# Patient Record
Sex: Male | Born: 1965 | Hispanic: No | Marital: Married | Smoking: Former smoker
Health system: Southern US, Community
[De-identification: ages and names within clinical notes are randomized; demographics above are authoritative.]

## PROBLEM LIST (undated history)

## (undated) DIAGNOSIS — F32A Depression, unspecified: Secondary | ICD-10-CM

## (undated) DIAGNOSIS — E119 Type 2 diabetes mellitus without complications: Secondary | ICD-10-CM

## (undated) DIAGNOSIS — I10 Essential (primary) hypertension: Secondary | ICD-10-CM

## (undated) DIAGNOSIS — E7800 Pure hypercholesterolemia, unspecified: Secondary | ICD-10-CM

## (undated) DIAGNOSIS — M109 Gout, unspecified: Secondary | ICD-10-CM

## (undated) DIAGNOSIS — E781 Pure hyperglyceridemia: Secondary | ICD-10-CM

## (undated) DIAGNOSIS — F329 Major depressive disorder, single episode, unspecified: Secondary | ICD-10-CM

## (undated) HISTORY — PX: LAPAROSCOPIC GASTRIC BANDING: SHX1100

---
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
## 1996-09-17 DIAGNOSIS — M109 Gout, unspecified: Secondary | ICD-10-CM | POA: Insufficient documentation

## 1998-02-06 DIAGNOSIS — E669 Obesity, unspecified: Secondary | ICD-10-CM | POA: Insufficient documentation

## 1998-02-17 DIAGNOSIS — E7800 Pure hypercholesterolemia, unspecified: Secondary | ICD-10-CM | POA: Insufficient documentation

## 2013-02-12 DIAGNOSIS — I10 Essential (primary) hypertension: Secondary | ICD-10-CM | POA: Insufficient documentation

## 2015-08-05 DIAGNOSIS — F329 Major depressive disorder, single episode, unspecified: Secondary | ICD-10-CM | POA: Insufficient documentation

## 2016-06-14 DIAGNOSIS — G4733 Obstructive sleep apnea (adult) (pediatric): Secondary | ICD-10-CM | POA: Insufficient documentation

## 2016-07-18 DIAGNOSIS — K219 Gastro-esophageal reflux disease without esophagitis: Secondary | ICD-10-CM | POA: Insufficient documentation

## 2017-09-12 DIAGNOSIS — N400 Enlarged prostate without lower urinary tract symptoms: Secondary | ICD-10-CM | POA: Insufficient documentation

## 2017-09-12 DIAGNOSIS — M109 Gout, unspecified: Secondary | ICD-10-CM | POA: Insufficient documentation

## 2017-11-06 DIAGNOSIS — F988 Other specified behavioral and emotional disorders with onset usually occurring in childhood and adolescence: Secondary | ICD-10-CM | POA: Insufficient documentation

## 2018-12-23 ENCOUNTER — Emergency Department
Admission: EM | Admit: 2018-12-23 | Discharge: 2018-12-23 | Disposition: A | Discharge status: Home / Self Care | Payer: 59 | Attending: Emergency Medicine | Admitting: Emergency Medicine

## 2018-12-23 ENCOUNTER — Encounter: Payer: Self-pay | Admitting: Emergency Medicine

## 2018-12-23 ENCOUNTER — Emergency Department: Payer: 59

## 2018-12-23 ENCOUNTER — Other Ambulatory Visit: Payer: Self-pay

## 2018-12-23 DIAGNOSIS — I10 Essential (primary) hypertension: Secondary | ICD-10-CM | POA: Diagnosis not present

## 2018-12-23 DIAGNOSIS — R0789 Other chest pain: Secondary | ICD-10-CM

## 2018-12-23 DIAGNOSIS — Z79899 Other long term (current) drug therapy: Secondary | ICD-10-CM | POA: Diagnosis not present

## 2018-12-23 DIAGNOSIS — E119 Type 2 diabetes mellitus without complications: Secondary | ICD-10-CM | POA: Diagnosis not present

## 2018-12-23 DIAGNOSIS — Z87891 Personal history of nicotine dependence: Secondary | ICD-10-CM | POA: Insufficient documentation

## 2018-12-23 HISTORY — DX: Essential (primary) hypertension: I10

## 2018-12-23 HISTORY — DX: Depression, unspecified: F32.A

## 2018-12-23 HISTORY — DX: Pure hyperglyceridemia: E78.1

## 2018-12-23 HISTORY — DX: Gout, unspecified: M10.9

## 2018-12-23 HISTORY — DX: Pure hypercholesterolemia, unspecified: E78.00

## 2018-12-23 HISTORY — DX: Type 2 diabetes mellitus without complications: E11.9

## 2018-12-23 LAB — BASIC METABOLIC PANEL
Anion gap: 12 (ref 5–15)
BUN: 14 mg/dL (ref 6–20)
CO2: 28 mmol/L (ref 22–32)
Calcium: 9.8 mg/dL (ref 8.9–10.3)
Chloride: 100 mmol/L (ref 98–111)
Creatinine, Ser: 1.15 mg/dL (ref 0.61–1.24)
GFR calc Af Amer: >60 mL/min (ref >60)
GFR calc non Af Amer: >60 mL/min (ref >60)
Glucose, Bld: 113 mg/dL — ABNORMAL HIGH (ref 70–99)
Potassium: 3.8 mmol/L (ref 3.5–5.1)
Sodium: 140 mmol/L (ref 135–145)

## 2018-12-23 LAB — CBC
HCT: 42.3 % (ref 39.0–52.0)
Hemoglobin: 15 g/dL (ref 13.0–17.0)
MCH: 30.2 pg (ref 26.0–34.0)
MCHC: 35.5 g/dL (ref 30.0–36.0)
MCV: 85.1 fL (ref 80.0–100.0)
Platelets: 272 10*3/uL (ref 150–400)
RBC: 4.97 MIL/uL (ref 4.22–5.81)
RDW: 13 % (ref 11.5–15.5)
WBC: 8.3 10*3/uL (ref 4.0–10.5)
nRBC: 0 % (ref 0.0–0.2)

## 2018-12-23 LAB — TROPONIN I (HIGH SENSITIVITY)
Troponin I (High Sensitivity): 4 ng/L (ref <18)
Troponin I (High Sensitivity): 3 ng/L (ref <18)

## 2018-12-23 MED ORDER — SODIUM CHLORIDE 0.9% FLUSH: 3.0000 mL | Freq: Once | INTRAVENOUS | Status: DC

## 2018-12-23 NOTE — ED Notes (Signed)
Patient transported to Xray by ED tech.

## 2018-12-23 NOTE — ED Notes (Signed)
Pt reports experiencing intense pain in his left chest, shoulder, and arm that began tonight while drying. States that his arm began to tingle after. States that yesterday he experiencing sharp chest pain yesterday that lasted 5 minutes. Denies SOB, diaphoresis. States that he has experienced dizziness along with the chest pain. States that his wife believes that he may be having these problems as a result of his ADHD medication. Reports hx of tendinitis in left arm.

## 2018-12-23 NOTE — ED Provider Notes (Signed)
[facility] Emergency Department Provider Note  ____________________________________________   First MD Initiated Contact with Patient 12/23/18 1833     (approximate)  I have reviewed the triage vital signs and the nursing notes.   HISTORY  Chief Complaint Chest Pain    HPI Cataldo Cosgriff is a 53 y.o. male  Here with chest pain. Pt was driving earlier when he began to experience a tingling sensation in his left upper arm. It then spread across his upper chest to his right upper arm. This was associated with some general tingling, mild aching. He told his wife, who is also in health care, who then questioned whether he was having a cardiac event. Pt subsequently understandably became anxious and felt somewhat lightheaded and worse. He subsequently presents for evaluation. He has no h/o known cardiac disease but does have HTN. Of note, he recently increased his Concentra, which he has noted has made him more anxious and have elevated Bp. No pain currently.  HPI: A 53 year old patient with a history of hypertension presents for evaluation of chest pain. Initial onset of pain was approximately 1-3 hours ago. The patient's chest pain is described as heaviness/pressure/tightness, is sharp and is not worse with exertion. The patient's chest pain is middle- or left-sided, is not well-localized and does not radiate to the arms/jaw/neck. The patient does not complain of nausea and denies diaphoresis. The patient has no history of stroke, has no history of peripheral artery disease, has not smoked in the past 90 days, denies any history of treated diabetes, has no relevant family history of coronary artery disease (first degree relative at less than age 25), has no history of hypercholesterolemia and does not have an elevated BMI (>=30).      Past Medical History:  Diagnosis Date  . Depression   . Diabetes mellitus without complication (HCC)    pre  . Gout   . High  cholesterol   . Hypertension   . Hypertriglyceridemia     There are no active problems to display for this patient.   Past Surgical History:  Procedure Laterality Date  . LAPAROSCOPIC GASTRIC BANDING      Prior to Admission medications   Not on File    Allergies Statins  No family history on file.  Social History Social History   Tobacco Use  . Smoking status: Former Games developer  . Smokeless tobacco: Never Used  Substance Use Topics  . Alcohol use: Not on file  . Drug use: Not on file    Review of Systems  Review of Systems  Constitutional: Negative for chills, fatigue and fever.  HENT: Negative for sore throat.   Respiratory: Positive for chest tightness. Negative for shortness of breath.   Cardiovascular: Negative for chest pain.  Gastrointestinal: Negative for abdominal pain.  Genitourinary: Negative for flank pain.  Musculoskeletal: Positive for arthralgias. Negative for neck pain.  Skin: Negative for rash and wound.  Allergic/Immunologic: Negative for immunocompromised state.  Neurological: Positive for light-headedness. Negative for weakness and numbness.  Hematological: Does not bruise/bleed easily.  All other systems reviewed and are negative.    ____________________________________________  PHYSICAL EXAM:      VITAL SIGNS: ED Triage Vitals  Enc Vitals Group     BP 12/23/18 1804 (!) 144/91     Pulse Rate 12/23/18 1804 87     Resp 12/23/18 1804 16     Temp 12/23/18 1804 98.5 F (36.9 C)     Temp Source 12/23/18 1804 Oral  SpO2 12/23/18 1804 100 %     Weight 12/23/18 1802 220 lb (99.8 kg)     Height 12/23/18 1802 5\' 8"  (1.727 m)     Head Circumference --      Peak Flow --      Pain Score 12/23/18 1802 7     Pain Loc --      Pain Edu? --      Excl. in [place]? --      Physical Exam Vitals signs and nursing note reviewed.  Constitutional:      General: He is not in acute distress.    Appearance: He is well-developed.  HENT:     Head:  Normocephalic and atraumatic.  Eyes:     Conjunctiva/sclera: Conjunctivae normal.  Neck:     Musculoskeletal: Neck supple.  Cardiovascular:     Rate and Rhythm: Normal rate and regular rhythm.     Heart sounds: Normal heart sounds. No murmur. No friction rub.  Pulmonary:     Effort: Pulmonary effort is normal. No respiratory distress.     Breath sounds: Normal breath sounds. No wheezing or rales.  Abdominal:     General: There is no distension.     Palpations: Abdomen is soft.     Tenderness: There is no abdominal tenderness.  Skin:    General: Skin is warm.     Capillary Refill: Capillary refill takes less than 2 seconds.  Neurological:     Mental Status: He is alert and oriented to person, place, and time.     Motor: No abnormal muscle tone.       ____________________________________________   LABS (all labs ordered are listed, but only abnormal results are displayed)  Labs Reviewed  BASIC METABOLIC PANEL - Abnormal; Notable for the following components:      Result Value   Glucose, Bld 113 (*)    All other components within normal limits  CBC  TROPONIN I (HIGH SENSITIVITY)  TROPONIN I (HIGH SENSITIVITY)    ____________________________________________  EKG: Normal sinus rhythm, VR 92. PR 148, QRS 102, QTc 460. No acute ST elevations or depressions. No ischemia or infarct. ________________________________________  RADIOLOGY All imaging, including plain films, CT scans, and ultrasounds, independently reviewed by me, and interpretations confirmed via formal radiology reads.  ED MD interpretation:   CXR: Clear  Official radiology report(s): Dg Chest 2 View  Result Date: 12/23/2018 CLINICAL DATA:  Left arm pain and numbness. EXAM: CHEST - 2 VIEW COMPARISON:  None. FINDINGS: The cardiomediastinal contours are normal. The lungs are clear. Pulmonary vasculature is normal. No consolidation, pleural effusion, or pneumothorax. No acute osseous abnormalities are seen.  IMPRESSION: No acute pulmonary process. Electronically Signed   By: Keith Rake M.D.   On: 12/23/2018 19:17    ____________________________________________  PROCEDURES   Procedure(s) performed (including Critical Care):  Procedures  ____________________________________________  INITIAL IMPRESSION / MDM / [place] / ED COURSE  As part of my medical decision making, I reviewed the following data within the [place] notes reviewed and incorporated, Old chart reviewed, Notes from prior ED visits, and [place] Controlled Substance Database  HEAR Score: 2    *Keldrick Pomplun was evaluated in Emergency Department on 12/23/2018 for the symptoms described in the history of present illness. He was evaluated in the context of the global COVID-19 pandemic, which necessitated consideration that the patient might be at risk for infection with the SARS-CoV-2 virus that causes COVID-19. Institutional protocols and algorithms that  pertain to the evaluation of patients at risk for COVID-19 are in a state of rapid change based on information released by regulatory bodies including the CDC and federal and state organizations. These policies and algorithms were followed during the patient's care in the ED.  Some ED evaluations and interventions may be delayed as a result of limited staffing during the pandemic.*     Medical Decision Making:  53 yo M here with atypical chest pain. Suspect possible MSK pain, less likely anxiety, less likely referred GI pain. May also consider referred pain from cervical etiology. His EKG is nonischemic and troponin neg x2 with HEART score <3 - do not suspect ACS at that time. Pain is not c/w PE. Labs otherwise reassuring. Of note, this did begin with increasing his prescribed Concentra, so symptomatic HTN or transient tachyarrhythmia could be a consideration. Will have him d/w his Psychiatrist and refer for outpt stress or CCTA.    ____________________________________________  FINAL CLINICAL IMPRESSION(S) / ED DIAGNOSES  Final diagnoses:  Atypical chest pain  Essential hypertension     MEDICATIONS GIVEN DURING THIS VISIT:  Medications - No data to display   ED Discharge Orders    None       Note:  This document was prepared using Dragon voice recognition software and may include unintentional dictation errors.   Shaune PollackIsaacs, Jahmier Willadsen, MD 12/23/18 [phone]

## 2018-12-23 NOTE — ED Notes (Signed)
ED Provider at bedside. 

## 2018-12-23 NOTE — Discharge Instructions (Addendum)
It was good to meet you today.  As we discussed, your HEART score was low (3) and with two negative high sensitivity Troponin I's, the likelihood of ischemia is low.  That being said, I'd recommend seeing Harrel Lemon soon to discuss possible repeat stress testing, or possible cardiac CT if you fit the screening criteria.  I'd suggest decreasing your Concerta and monitoring blood pressure. This could certainly be contributing to your high blood pressure and symptoms.  Have a good Thanskgiving!

## 2018-12-23 NOTE — ED Triage Notes (Signed)
C/O left arm pain / numbness while driving in the car this evening.  Onset of symptoms about 1 hour ago.  STates had an episode of chest discomfort yesterday.  AAOx3.  Skin warm and dry.  No SOB/ DOE.  NAD

## 2019-08-20 ENCOUNTER — Ambulatory Visit: Payer: No Typology Code available for payment source

## 2019-08-20 ENCOUNTER — Other Ambulatory Visit: Payer: Self-pay

## 2019-08-20 ENCOUNTER — Other Ambulatory Visit: Payer: Self-pay | Admitting: Neurology

## 2019-08-20 DIAGNOSIS — R200 Anesthesia of skin: Secondary | ICD-10-CM

## 2019-08-24 ENCOUNTER — Ambulatory Visit: Payer: No Typology Code available for payment source

## 2019-08-24 ENCOUNTER — Other Ambulatory Visit: Payer: Self-pay | Admitting: Neurology

## 2019-08-24 DIAGNOSIS — R200 Anesthesia of skin: Secondary | ICD-10-CM

## 2019-08-28 ENCOUNTER — Other Ambulatory Visit: Payer: Self-pay

## 2019-08-28 ENCOUNTER — Ambulatory Visit
Admission: RE | Admit: 2019-08-28 | Discharge: 2019-08-28 | Disposition: A | Discharge status: Home / Self Care | Payer: No Typology Code available for payment source | Source: Ambulatory Visit | Attending: Neurology | Admitting: Neurology

## 2019-08-28 DIAGNOSIS — R200 Anesthesia of skin: Secondary | ICD-10-CM

## 2019-08-28 MED ORDER — GADOBENATE DIMEGLUMINE 529 MG/ML IV SOLN
20.0000 mL | Freq: Once | INTRAVENOUS | Status: AC | PRN
  Administered 2019-08-28: 20 mL via INTRAVENOUS

## 2019-12-13 DIAGNOSIS — K9509 Other complications of gastric band procedure: Secondary | ICD-10-CM | POA: Insufficient documentation

## 2020-03-20 ENCOUNTER — Other Ambulatory Visit: Payer: Self-pay | Admitting: Surgical Oncology

## 2020-03-20 DIAGNOSIS — K9509 Other complications of gastric band procedure: Secondary | ICD-10-CM

## 2020-04-06 ENCOUNTER — Ambulatory Visit
Admission: RE | Admit: 2020-04-06 | Discharge: 2020-04-06 | Disposition: A | Discharge status: Home / Self Care | Payer: No Typology Code available for payment source | Source: Ambulatory Visit | Attending: Surgical Oncology | Admitting: Surgical Oncology

## 2020-04-06 ENCOUNTER — Other Ambulatory Visit: Payer: Self-pay | Admitting: Surgical Oncology

## 2020-04-06 DIAGNOSIS — K9509 Other complications of gastric band procedure: Secondary | ICD-10-CM

## 2020-07-11 DIAGNOSIS — E119 Type 2 diabetes mellitus without complications: Secondary | ICD-10-CM | POA: Insufficient documentation

## 2021-08-29 IMAGING — RF DG UGI W SINGLE CM
4 series · 14 of 24 positions shown · non-contrast
Comparison: None.

CLINICAL DATA: Dysphagia and epigastric discomfort. Gastric lap
band placement approximately 10 years ago.

EXAM:
UPPER GI SERIES WITH KUB
TECHNIQUE: After obtaining a scout radiograph a routine upper GI series was
performed using thin barium.
FLUOROSCOPY TIME:  Fluoroscopy Time:  3 minutes 36 seconds
Radiation Exposure Index (if provided by the fluoroscopic device):
791.8 mGy
Number of Acquired Spot Images: 0

[Series 1: one shot · 0.14mm/px · 3 of 6 slices shown (1 of 2)]
[im 1/6]
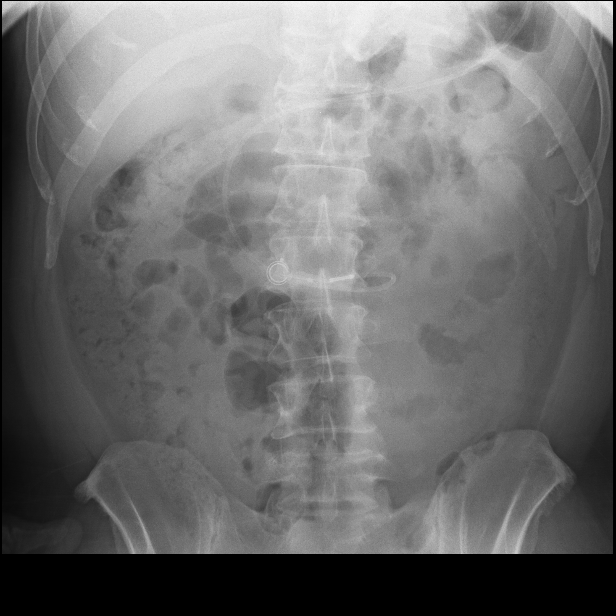
[im 4/6]
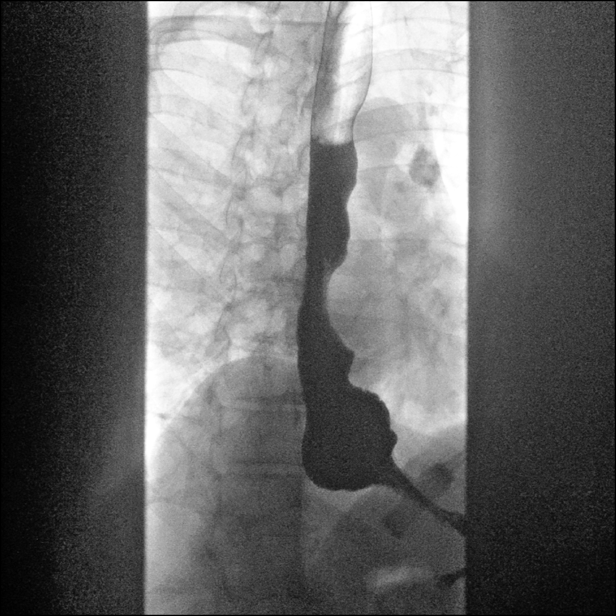
[im 6/6]
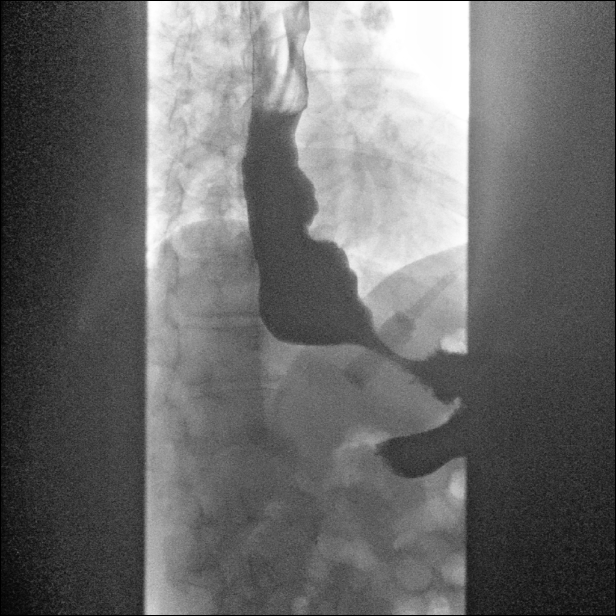

[Series 2: sequence · 2 of 49 frames shown (1 of 2)]
[frame 28/49]
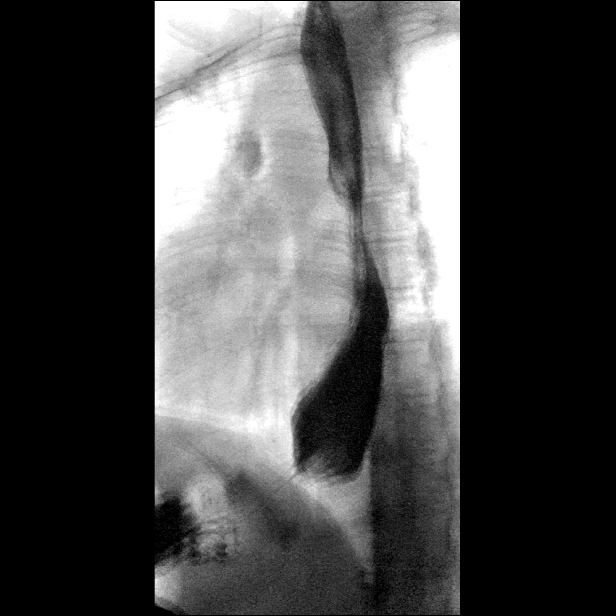
[frame 42/49]
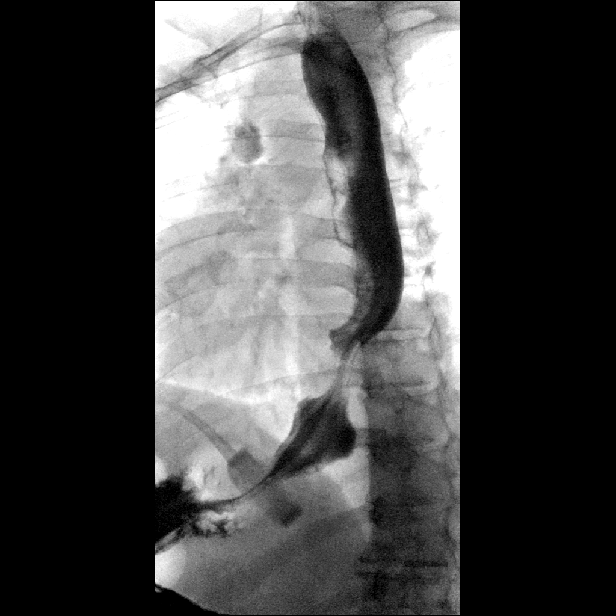

[Series 3: sequence · 1 of 74 frames shown (2 of 2)]
[frame 38/74]
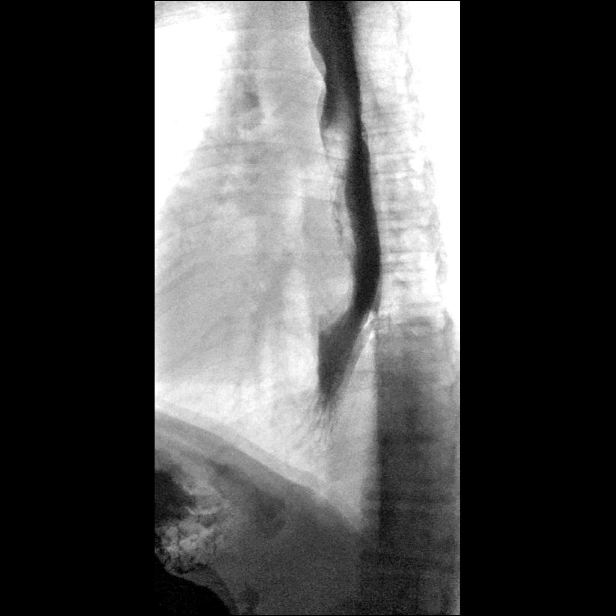

[Series 4: one shot · 8 of 17 slices shown (2 of 2)]
[im 1/17]
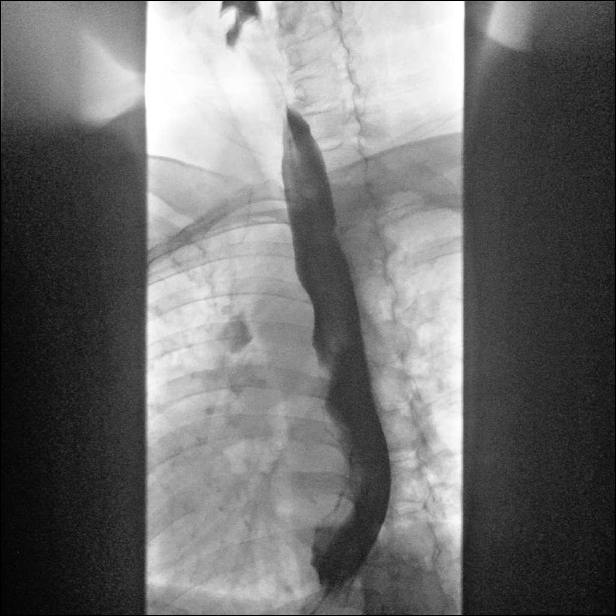
[im 2/17]
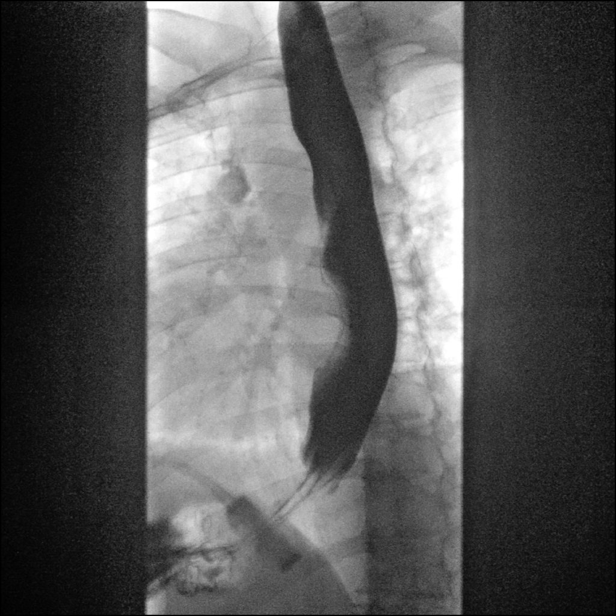
[im 5/17]
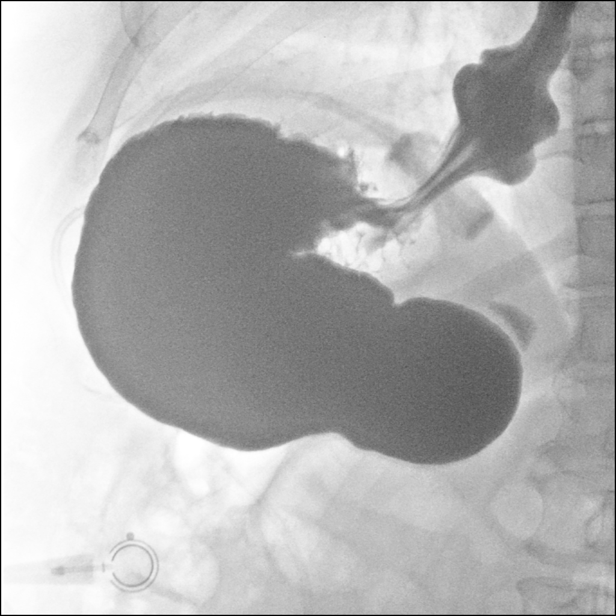
[im 7/17]
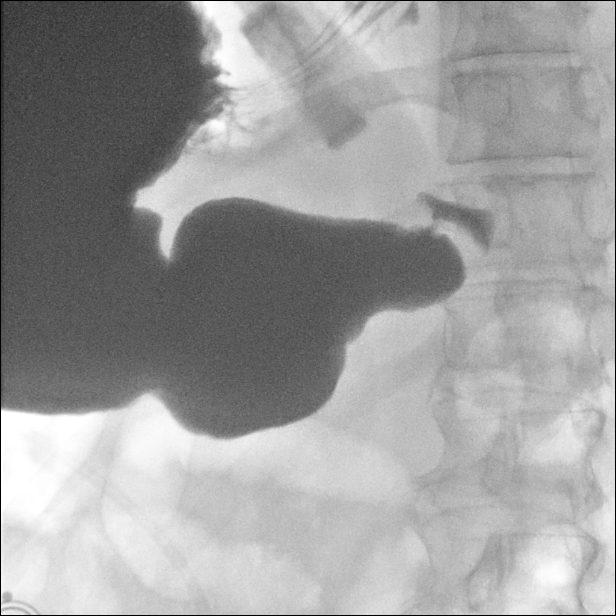
[im 10/17]
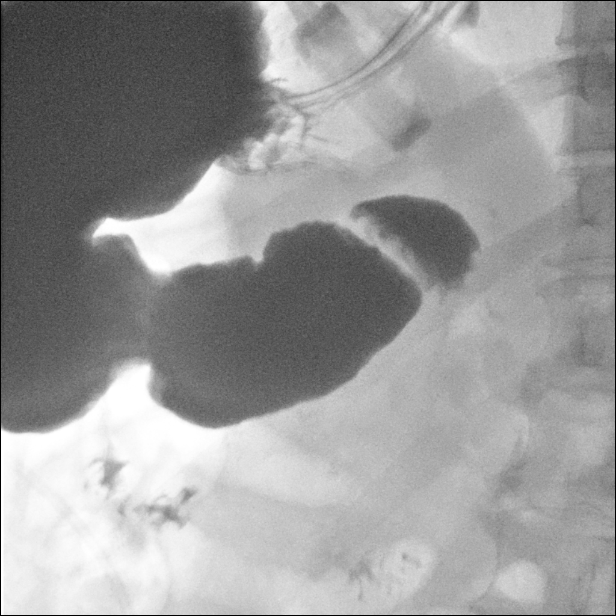
[im 11/17]
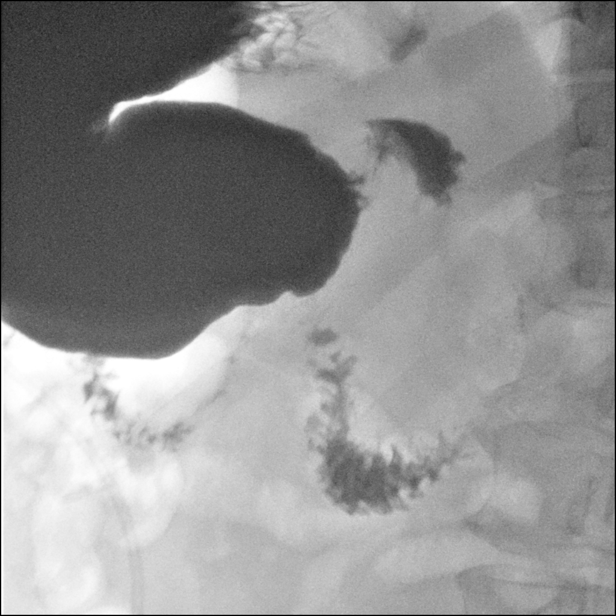
[im 13/17]
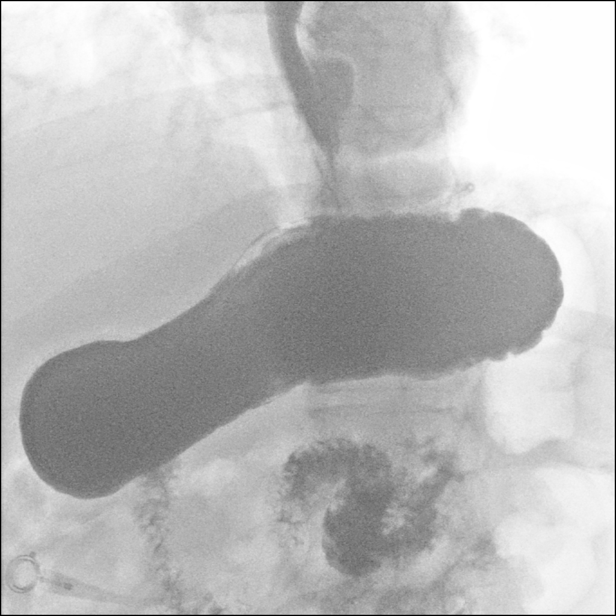
[im 17/17]
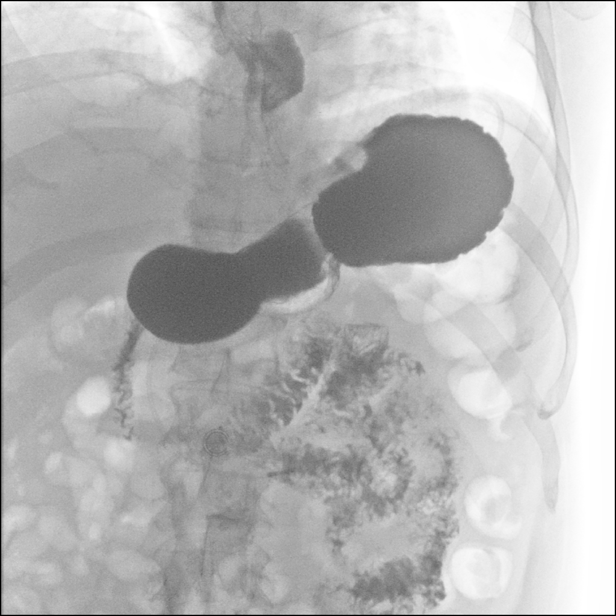

[14 of 24 positions shown; findings below may reference images not displayed]

FINDINGS: Scout radiograph: Unremarkable bowel gas pattern. Gastric lap band
is seen in appropriate position.

Esophagus: No evidence of esophageal mass or stricture. Gastric lap
band is seen in appropriate position. Mild dilatation of the distal
thoracic esophagus and ampulla is seen above the level of the lap
band. Mild smooth narrowing of the GE junction is seen at the site
of the lap band, however this does not prevent passage of barium
into the stomach.

Mild esophageal dysmotility is seen, with break up of primary
peristalsis in the midthoracic esophagus. Secondary peristaltic
waves are observed, however there is some retention of contrast in
the upper thoracic esophagus. No tertiary contractions seen. No
gastroesophageal reflux observed.

Stomach: Gastric lap band is seen in appropriate position. No hiatal
hernia visualized. Normal size and appearance. No evidence of
gastric mass or ulcer.

Duodenum: Normal appearance of duodenal bulb and sweep. Normal
position of ligament of Treitz. No ulcer, stricture, or other
significant abnormality seen.

Other:  None.
IMPRESSION: Gastric lap band in appropriate position. Mild smooth narrowing is
seen at the site of the lap band, however this does not prevent
passage of barium.

Mild dilatation of distal thoracic esophagus. Mild esophageal
dysmotility.

No evidence of esophageal stricture or hiatal hernia.

Normal appearance of stomach and duodenum.

## 2022-02-07 ENCOUNTER — Ambulatory Visit: Payer: BC Managed Care – PPO | Admitting: Urology

## 2022-02-07 ENCOUNTER — Encounter: Payer: Self-pay | Admitting: Urology

## 2022-02-07 VITALS — BP 130/74 | HR 80 | Ht 68.0 in | Wt 220.0 lb

## 2022-02-07 DIAGNOSIS — N401 Enlarged prostate with lower urinary tract symptoms: Secondary | ICD-10-CM | POA: Diagnosis not present

## 2022-02-07 DIAGNOSIS — Z125 Encounter for screening for malignant neoplasm of prostate: Secondary | ICD-10-CM

## 2022-02-07 DIAGNOSIS — R399 Unspecified symptoms and signs involving the genitourinary system: Secondary | ICD-10-CM | POA: Diagnosis not present

## 2022-02-07 DIAGNOSIS — N529 Male erectile dysfunction, unspecified: Secondary | ICD-10-CM

## 2022-02-07 LAB — URINALYSIS, COMPLETE
Bilirubin, UA: NEGATIVE
Glucose, UA: NEGATIVE
Ketones, UA: NEGATIVE
Leukocytes,UA: NEGATIVE
Nitrite, UA: NEGATIVE
Protein,UA: NEGATIVE
RBC, UA: NEGATIVE
Specific Gravity, UA: 1.025 (ref 1.005–1.030)
Urobilinogen, Ur: 0.2 mg/dL (ref 0.2–1.0)
pH, UA: 6 (ref 5.0–7.5)

## 2022-02-07 LAB — MICROSCOPIC EXAMINATION

## 2022-02-07 LAB — BLADDER SCAN AMB NON-IMAGING: Scan Result: 0

## 2022-02-07 MED ORDER — TADALAFIL 20 MG PO TABS: ORAL_TABLET | ORAL | 3 refills | Status: AC

## 2022-02-07 NOTE — Progress Notes (Signed)
02/07/2022 4:08 PM   [place] 1965-06-16 [id number]  Referring provider: Baxter Hire, MD [place],  [place] [address]  Chief Complaint  Patient presents with   Benign Prostatic Hypertrophy    HPI: Daniel Jarvis is a 57 y.o. male referred for evaluation of BPH  Has been taking dutasteride for ~ 10 years primarily for hair loss Notes occasional urgency and postvoid dribbling No dysuria or gross hematuria PSA 01/02/2022 was 0.12 (uncorrected) Also with difficulty maintaining an erection.  No previous treatments.  Organic ED risk factors include hypercholesterolemia, hypertension  PMH: Past Medical History:  Diagnosis Date   Depression    Diabetes mellitus without complication (HCC)    pre   Gout    High cholesterol    Hypertension    Hypertriglyceridemia     Surgical History: Past Surgical History:  Procedure Laterality Date   LAPAROSCOPIC GASTRIC BANDING      Home Medications:  Allergies as of 02/07/2022       Reactions   Statins         Medication List        Accurate as of February 07, 2022  4:08 PM. If you have any questions, ask your nurse or doctor.          allopurinol 300 MG tablet Commonly known as: ZYLOPRIM Take 1 tablet by mouth daily.   aspirin EC 81 MG tablet Take by mouth.   citalopram 40 MG tablet Commonly known as: CELEXA Take 40 mg by mouth daily.   dutasteride 0.5 MG capsule Commonly known as: AVODART Take 1 capsule by mouth daily.   hydrochlorothiazide 12.5 MG tablet Commonly known as: HYDRODIURIL Take by mouth.   Ozempic (0.25 or 0.5 MG/DOSE) 2 MG/3ML Sopn Generic drug: Semaglutide(0.25 or 0.5MG /DOS) Inject into the skin.        Allergies:  Allergies  Allergen Reactions   Statins     Family History: No family history on file.  Social History:  reports that he has quit smoking. He has never used smokeless tobacco. No history on file for alcohol use and drug use.   Physical  Exam: BP 130/74   Pulse 80   Ht 5\' 8"  (1.727 m)   Wt 220 lb (99.8 kg)   BMI 33.45 kg/m   Constitutional:  Alert, No acute distress. HEENT: [place] AT Respiratory: Normal respiratory effort, no increased work of breathing. GU: Prostate 25 g, smooth without nodules Psychiatric: Normal mood and affect.  Laboratory Data:  Urinalysis Dipstick/microscopy negative   Assessment & Plan:    1.  Lower urinary tract symptoms Mild LUTS which are not bothersome at present On dutasteride ~ 10 years for hair loss.  He inquired about any potential long-term side effects.  We discussed the 5-ARI medications do not increase the risk of high-grade prostate cancer-the increased diagnosis of high-grade cancer in the prostate cancer prevention trial was due to increased detection due to size reduction and he is okay to continue PVR today 0 mL If he has worsening symptoms in the future would add an alpha-blocker  2.  Erectile dysfunction Primarily difficulty maintaining an erection He was interested in a PDE 5 inhibitor trial and Rx tadalafil was sent to pharmacy If he has persistent difficulty maintaining an erection on these medications we discussed the possibility of venoocclusive disease and discussed use of a venous compression band  3.  Prostate cancer screening Discussed current prostate cancer screening recommendations by the AUA and NCCN of  a PSA and DRE annually between the ages of 56-69 He desires to continue annual urology follow-up   Abbie Sons, [place] [address] [place], [place] [address] [phone]

## 2022-02-10 ENCOUNTER — Encounter: Payer: Self-pay | Admitting: Urology

## 2022-05-19 DIAGNOSIS — R7989 Other specified abnormal findings of blood chemistry: Secondary | ICD-10-CM | POA: Insufficient documentation

## 2022-09-19 ENCOUNTER — Ambulatory Visit: Payer: BC Managed Care – PPO

## 2022-09-19 DIAGNOSIS — K602 Anal fissure, unspecified: Secondary | ICD-10-CM | POA: Diagnosis not present

## 2022-09-19 DIAGNOSIS — Z86010 Personal history of colonic polyps: Secondary | ICD-10-CM | POA: Diagnosis not present

## 2022-09-19 DIAGNOSIS — Z09 Encounter for follow-up examination after completed treatment for conditions other than malignant neoplasm: Secondary | ICD-10-CM | POA: Diagnosis present

## 2023-02-10 ENCOUNTER — Ambulatory Visit: Payer: BC Managed Care – PPO | Admitting: Urology

## 2023-02-17 ENCOUNTER — Ambulatory Visit: Payer: BC Managed Care – PPO | Admitting: Urology

## 2023-03-13 ENCOUNTER — Ambulatory Visit: Payer: Self-pay | Admitting: Urology

## 2023-03-13 VITALS — BP 133/78 | HR 74 | Ht 68.0 in | Wt 234.0 lb

## 2023-03-13 DIAGNOSIS — E291 Testicular hypofunction: Secondary | ICD-10-CM

## 2023-03-13 DIAGNOSIS — N529 Male erectile dysfunction, unspecified: Secondary | ICD-10-CM

## 2023-03-13 DIAGNOSIS — N401 Enlarged prostate with lower urinary tract symptoms: Secondary | ICD-10-CM

## 2023-03-13 DIAGNOSIS — R399 Unspecified symptoms and signs involving the genitourinary system: Secondary | ICD-10-CM

## 2023-03-13 DIAGNOSIS — N5201 Erectile dysfunction due to arterial insufficiency: Secondary | ICD-10-CM

## 2023-03-13 LAB — BLADDER SCAN AMB NON-IMAGING: Scan Result: 1

## 2023-03-13 LAB — URINALYSIS, COMPLETE
Bilirubin, UA: NEGATIVE
Glucose, UA: NEGATIVE
Ketones, UA: NEGATIVE
Leukocytes,UA: NEGATIVE
Nitrite, UA: NEGATIVE
Protein,UA: NEGATIVE
RBC, UA: NEGATIVE
Specific Gravity, UA: 1.015 (ref 1.005–1.030)
Urobilinogen, Ur: 1 mg/dL (ref 0.2–1.0)
pH, UA: 6 (ref 5.0–7.5)

## 2023-03-13 LAB — MICROSCOPIC EXAMINATION: Bacteria, UA: NONE SEEN

## 2023-03-13 NOTE — Progress Notes (Signed)
 I, Maysun Anabel Bene, acting as a scribe for Riki Altes, MD., have documented all relevant documentation on the behalf of Riki Altes, MD, as directed by Riki Altes, MD while in the presence of Riki Altes, MD.  03/13/2023 4:33 PM   Daniel Jarvis [birth year] [id number]  Referring provider: Gracelyn Nurse, MD [address] [facility] [place],  [place] [address]  Chief Complaint  Patient presents with   Follow-up   Urologic History: 1. BPH with LUTS Finasteride 5mg  daily  HPI: Daniel Jarvis is a 58 y.o. male presents for annual follow-up.  Initially seen January 2024 for a BPH with lower urinary tract symptoms and erectile dysfunction Presently on finasteride and tadalafil.  He started testosterone replacement via an online site approximately 6 months ago. His testosterone level was < 300 and he was initially on injections, however had a skin reaction to the grapeseed oil and is presently on topical testosterone.  Most recent lab, remarkable for testosterone level in the mid-500s range. He does see a better libido and energy levels. PSA February 2025 was 0.19 and urinalysis was unremarkable.  IPSS today 7/35.   PMH: Past Medical History:  Diagnosis Date   Depression    Diabetes mellitus without complication (HCC)    pre   Gout    High cholesterol    Hypertension    Hypertriglyceridemia     Surgical History: Past Surgical History:  Procedure Laterality Date   LAPAROSCOPIC GASTRIC BANDING      Home Medications:  Allergies as of 03/13/2023       Reactions   Lisinopril Other (See Comments)   cough cough  cough   Statins         Medication List        Accurate as of March 13, 2023  4:33 PM. If you have any questions, ask your nurse or doctor.          STOP taking these medications    dutasteride 0.5 MG capsule Commonly known as: AVODART Stopped by: Riki Altes   Ozempic (0.25 or 0.5 MG/DOSE) 2 MG/3ML  Sopn Generic drug: Semaglutide(0.25 or 0.5MG /DOS) Stopped by: Riki Altes       TAKE these medications    allopurinol 300 MG tablet Commonly known as: ZYLOPRIM Take 1 tablet by mouth daily.   aspirin EC 81 MG tablet Take by mouth.   citalopram 40 MG tablet Commonly known as: CELEXA Take 40 mg by mouth daily.   finasteride 5 MG tablet Commonly known as: PROSCAR Take 5 mg by mouth daily.   hydrochlorothiazide 12.5 MG tablet Commonly known as: HYDRODIURIL Take by mouth.   tadalafil 20 MG tablet Commonly known as: CIALIS 1 tab 1 hour prior to intercourse   TESTOSTERONE NA Place into the nose. Not sure of the dose, gets this via virtual provider/website        Allergies:  Allergies  Allergen Reactions   Lisinopril Other (See Comments)    cough  cough  cough   Statins    Social History:  reports that he has quit smoking. He has never used smokeless tobacco. No history on file for alcohol use and drug use.   Physical Exam: BP 133/78   Pulse 74   Ht 5\' 8"  (1.727 m)   Wt 234 lb (106.1 kg)   BMI 35.58 kg/m   Constitutional:  Alert and oriented, No acute distress. HEENT: [place] AT. Respiratory: Normal respiratory effort, no increased  work of breathing. Psychiatric: Normal mood and affect.  Assessment & Plan:    1. BPH with LUTS Stable on finasteride  2. Erectile dysfunction Stable on tadalafil and TRT  3. Hypogonadism TRT as above, which is monitored by his online site. He will tentatively schedule a 1 year follow-up, however if doing well, he was informed he could bump the visit out for an additional year.   I have reviewed the above documentation for accuracy and completeness, and I agree with the above.   Riki Altes, MD  [facility] [address] [place], [place] [address] [phone]

## 2023-03-20 ENCOUNTER — Encounter: Payer: Self-pay | Admitting: Urology

## 2023-10-06 ENCOUNTER — Other Ambulatory Visit: Payer: Self-pay | Admitting: Student

## 2023-10-06 ENCOUNTER — Encounter: Payer: Self-pay | Admitting: Student

## 2023-10-06 DIAGNOSIS — G8929 Other chronic pain: Secondary | ICD-10-CM

## 2023-10-06 DIAGNOSIS — M7582 Other shoulder lesions, left shoulder: Secondary | ICD-10-CM

## 2023-10-06 DIAGNOSIS — M25812 Other specified joint disorders, left shoulder: Secondary | ICD-10-CM

## 2023-10-14 ENCOUNTER — Ambulatory Visit
Admission: RE | Admit: 2023-10-14 | Discharge: 2023-10-14 | Disposition: A | Discharge status: Home / Self Care | Payer: Self-pay | Source: Ambulatory Visit | Attending: Student

## 2023-10-14 ENCOUNTER — Other Ambulatory Visit: Payer: Self-pay | Admitting: Student

## 2023-10-14 ENCOUNTER — Inpatient Hospital Stay
Admission: RE | Admit: 2023-10-14 | Discharge: 2023-10-14 | Disposition: A | Discharge status: Home / Self Care | Payer: Self-pay | Source: Ambulatory Visit | Attending: Student

## 2023-10-14 DIAGNOSIS — M7582 Other shoulder lesions, left shoulder: Secondary | ICD-10-CM

## 2023-10-14 DIAGNOSIS — G8929 Other chronic pain: Secondary | ICD-10-CM

## 2023-10-14 DIAGNOSIS — M25512 Pain in left shoulder: Secondary | ICD-10-CM | POA: Insufficient documentation

## 2023-10-14 DIAGNOSIS — M25812 Other specified joint disorders, left shoulder: Secondary | ICD-10-CM | POA: Diagnosis not present

## 2023-10-14 MED ORDER — SODIUM CHLORIDE (PF) 0.9% IJ SOLUTION - NO CHARGE
20.0000 mL | INTRAMUSCULAR | Status: DC | PRN
  Administered 2023-10-14: 5 mL

## 2023-10-14 MED ORDER — LIDOCAINE 1 % OPTIME INJ - NO CHARGE
10.0000 mL | Freq: Once | INTRAMUSCULAR | Status: AC
  Administered 2023-10-14: 4 mL via SUBCUTANEOUS
  Filled 2023-10-14: qty 10

## 2023-10-14 MED ORDER — GADOBUTROL 1 MMOL/ML IV SOLN
2.0000 mL | Freq: Once | INTRAVENOUS | Status: AC | PRN
  Administered 2023-10-14: 0.05 mL

## 2023-10-14 MED ORDER — IOHEXOL 180 MG/ML  SOLN
20.0000 mL | Freq: Once | INTRAMUSCULAR | Status: AC | PRN
  Administered 2023-10-14: 15 mL

## 2023-10-14 NOTE — CV Procedure (Signed)
 Interventional Radiology Procedure Note  Risks and benefits of arthrogram were discussed with the patient including, but not limited to bleeding, infection, injection of contrast outside the joint, and damage to adjacent structures.  All of the patient's questions were answered, patient is agreeable to proceed. Consent signed and in chart.  A timeout was performed with all members of the team prior to start of the procedure. Correct patient and correct procedure was confirmed. Allergies were reviewed.   PROCEDURE SUMMARY:  Successful fluoro guided left shoulder arthrogram. No immediate complications.  Pt tolerated well.   EBL = none  Please see full dictation in imaging section of Epic for procedure details.   Electronically Signed: Carlin DELENA Griffon, PA-C 10/14/2023, 10:26 AM

## 2023-12-02 ENCOUNTER — Other Ambulatory Visit: Payer: Self-pay

## 2023-12-02 DIAGNOSIS — Z23 Encounter for immunization: Secondary | ICD-10-CM | POA: Diagnosis not present

## 2024-03-09 ENCOUNTER — Ambulatory Visit: Payer: Self-pay | Admitting: Urology

## 2024-03-12 ENCOUNTER — Ambulatory Visit: Payer: Self-pay | Admitting: Urology
